# Patient Record
Sex: Male | Born: 1995 | Race: White | Hispanic: No | Marital: Single | Smoking: Never smoker
Health system: Southern US, Community
[De-identification: ages and names within clinical notes are randomized; demographics above are authoritative.]

## PROBLEM LIST (undated history)

## (undated) DIAGNOSIS — J45909 Unspecified asthma, uncomplicated: Secondary | ICD-10-CM

## (undated) HISTORY — PX: WISDOM TOOTH EXTRACTION: SHX21

---
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
## 2016-03-03 NOTE — H&P (Signed)
  Subjective:    Patient ID: Gerald Silva is a 20 y.o. male.  Follow-up   Here for follow up discussion prior to planned septoplasty, inferior turbinate reduction. Presented with complaint for years of feeling stuffy alternating with runny nose. Notes multiple trauma to nose from playing sports, but feels nose has been deviated to right as he has grown. Feels he breathes better out of right. Denies snoring but reports mouth breathing while sleeping. Has tried OTC allergy medication without improvement, did not some improvement in breathing with Afrin. Twin sister is patient for nasal trauma. Mother reports allergy testing as child; patient reports some worsening of symptoms with pollen exposure.  Review of Systems     Objective:   Physical Exam  Constitutional: He appears well-developed and well-nourished.  Cardiovascular: Normal rate and normal heart sounds.   Pulmonary/Chest: Effort normal and breath sounds normal.  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 Counseled this may have no effect on appearance nose. Reviewed incision over caudal septum, resection of this, scoring of septum. Reviewed intranasal splints for 5-7 days, OP surgery, GA, risks dryness breathing, continued obstruction, common to have nausea with this procedure.   All Rx provided today, request phenergan as has had nausea with pain medication in past.  At today's visit had questions regarding rhinoplasty as has some desire for straighter appearing nose. Reviewed incisions for this open vs closed and need for osteotomies, external splint, pain  bruising and post op limitation for this. This would more effectively straighten nose but with greater recovery. Patient and mother desire to proceed with septoplasty and outfracture inferior turbinates alone.   Glenna FellowsBrinda Guila Owensby, MD [facility] Pl[facility] Reconstructive Surgery [phone], pin [phone]

## 2016-03-10 ENCOUNTER — Other Ambulatory Visit (HOSPITAL_COMMUNITY): Payer: Self-pay

## 2016-03-11 ENCOUNTER — Encounter (HOSPITAL_COMMUNITY)
Admission: RE | Admit: 2016-03-11 | Discharge: 2016-03-11 | Disposition: A | Discharge status: Home / Self Care | Payer: Managed Care, Other (non HMO) | Source: Ambulatory Visit | Attending: Plastic Surgery | Admitting: Plastic Surgery

## 2016-03-11 ENCOUNTER — Encounter (HOSPITAL_COMMUNITY): Payer: Self-pay

## 2016-03-11 DIAGNOSIS — J342 Deviated nasal septum: Secondary | ICD-10-CM | POA: Diagnosis present

## 2016-03-11 DIAGNOSIS — J343 Hypertrophy of nasal turbinates: Secondary | ICD-10-CM | POA: Diagnosis not present

## 2016-03-11 HISTORY — DX: Unspecified asthma, uncomplicated: J45.909

## 2016-03-11 LAB — CBC
HEMATOCRIT: 45.2 % (ref 39.0–52.0)
HEMOGLOBIN: 15.2 g/dL (ref 13.0–17.0)
MCH: 27.9 pg (ref 26.0–34.0)
MCHC: 33.6 g/dL (ref 30.0–36.0)
MCV: 83.1 fL (ref 78.0–100.0)
Platelets: 260 10*3/uL (ref 150–400)
RBC: 5.44 MIL/uL (ref 4.22–5.81)
RDW: 13.1 % (ref 11.5–15.5)
WBC: 7.3 10*3/uL (ref 4.0–10.5)

## 2016-03-11 NOTE — Pre-Procedure Instructions (Signed)
    Levander CampionCharles C Wrightsman  03/11/2016    Your procedure is scheduled on Friday, July 21..  [facility]                  Your surgery or procedure is scheduled for 7:30 AM   Call this number if you have problems the morning of surgery: [phone]   Remember:  Do not eat food or drink liquids after midnight.  Take these medicines the morning of surgery with A SIP OF WATER : -    Do not wear jewelry, make-up or nail polish.  Do not wear lotions, powders, or perfumes.               Men may shave face and neck.  Do not bring valuables to the hospital.  [facility] any belongings or valuables.  Contacts, dentures or bridgework may not be worn into surgery.  Leave your suitcase in the car.  After surgery it may be brought to your room.  For patients admitted to the hospital, discharge time will be determined by your treatment team.  Special instructions:  Review  [facility] - Preparing For Surgery.  Please read over the following fact sheets that you were given. Coughing and Deep Breathing and Review  [facility] - Preparing For Surgery.

## 2016-03-11 NOTE — Anesthesia Preprocedure Evaluation (Addendum)
Anesthesia Evaluation  Patient identified by MRN, date of birth, ID band Patient awake    Reviewed: Allergy & Precautions, NPO status , Patient's Chart, lab work & pertinent test results  History of Anesthesia Complications Negative for: history of anesthetic complications  Airway Mallampati: I  TM Distance: >3 FB Neck ROM: Full    Dental  (+) Teeth Intact, Dental Advisory Given   Pulmonary    Pulmonary exam normal breath sounds clear to auscultation       Cardiovascular Exercise Tolerance: Good negative cardio ROS Normal cardiovascular exam Rhythm:Regular Rate:Normal     Neuro/Psych negative neurological ROS  negative psych ROS   GI/Hepatic negative GI ROS, Neg liver ROS,   Endo/Other  negative endocrine ROS  Renal/GU negative Renal ROS     Musculoskeletal negative musculoskeletal ROS (+)   Abdominal   Peds negative pediatric ROS (+)  Hematology negative hematology ROS (+)   Anesthesia Other Findings Day of surgery medications reviewed with the patient.  Reproductive/Obstetrics                            Anesthesia Physical Anesthesia Plan  ASA: I  Anesthesia Plan: General   Post-op Pain Management:    Induction: Intravenous  Airway Management Planned: Oral ETT  Additional Equipment:   Intra-op Plan:   Post-operative Plan: Extubation in OR  Informed Consent: I have reviewed the patients History and Physical, chart, labs and discussed the procedure including the risks, benefits and alternatives for the proposed anesthesia with the patient or authorized representative who has indicated his/her understanding and acceptance.   Dental advisory given  Plan Discussed with: CRNA  Anesthesia Plan Comments: (Risks/benefits of general anesthesia discussed with patient including risk of damage to teeth, lips, gum, and tongue, nausea/vomiting, allergic reactions to medications, and  the possibility of heart attack, stroke and death.  All patient questions answered.  Patient wishes to proceed.)       Anesthesia Quick Evaluation

## 2016-03-12 ENCOUNTER — Ambulatory Visit (HOSPITAL_COMMUNITY): Payer: Managed Care, Other (non HMO) | Admitting: Certified Registered"

## 2016-03-12 ENCOUNTER — Ambulatory Visit (HOSPITAL_COMMUNITY)
Admission: RE | Admit: 2016-03-12 | Discharge: 2016-03-12 | Disposition: A | Discharge status: Home / Self Care | Payer: Managed Care, Other (non HMO) | Source: Ambulatory Visit | Attending: Plastic Surgery | Admitting: Plastic Surgery

## 2016-03-12 ENCOUNTER — Encounter (HOSPITAL_COMMUNITY): Payer: Self-pay | Admitting: *Deleted

## 2016-03-12 ENCOUNTER — Encounter (HOSPITAL_COMMUNITY)
Admission: RE | Disposition: A | Discharge status: Home / Self Care | Payer: Self-pay | Source: Ambulatory Visit | Attending: Plastic Surgery

## 2016-03-12 DIAGNOSIS — J343 Hypertrophy of nasal turbinates: Secondary | ICD-10-CM | POA: Insufficient documentation

## 2016-03-12 DIAGNOSIS — J342 Deviated nasal septum: Secondary | ICD-10-CM | POA: Insufficient documentation

## 2016-03-12 HISTORY — PX: NASAL SEPTOPLASTY W/ TURBINOPLASTY: SHX2070

## 2016-03-12 SURGERY — SEPTOPLASTY, NOSE, WITH NASAL TURBINATE REDUCTION
Anesthesia: General | Site: Nose

## 2016-03-12 MED ORDER — LIDOCAINE-EPINEPHRINE 1 %-1:100000 IJ SOLN
INTRAMUSCULAR | Status: DC | PRN
  Administered 2016-03-12: 8 mL

## 2016-03-12 MED ORDER — FENTANYL CITRATE (PF) 100 MCG/2ML IJ SOLN
25.0000 ug | INTRAMUSCULAR | Status: DC | PRN
  Administered 2016-03-12: 25 ug via INTRAVENOUS
  Administered 2016-03-12: 50 ug via INTRAVENOUS
  Administered 2016-03-12: 25 ug via INTRAVENOUS
  Administered 2016-03-12: 50 ug via INTRAVENOUS

## 2016-03-12 MED ORDER — MIDAZOLAM HCL 5 MG/5ML IJ SOLN
INTRAMUSCULAR | Status: DC | PRN
  Administered 2016-03-12: 2 mg via INTRAVENOUS

## 2016-03-12 MED ORDER — PROMETHAZINE HCL 25 MG/ML IJ SOLN: 6.2500 mg | INTRAMUSCULAR | Status: DC | PRN

## 2016-03-12 MED ORDER — 0.9 % SODIUM CHLORIDE (POUR BTL) OPTIME
TOPICAL | Status: DC | PRN
  Administered 2016-03-12: 1000 mL

## 2016-03-12 MED ORDER — LIDOCAINE HCL (CARDIAC) 20 MG/ML IV SOLN
INTRAVENOUS | Status: DC | PRN
  Administered 2016-03-12: 100 mg via INTRAVENOUS

## 2016-03-12 MED ORDER — LACTATED RINGERS IV SOLN
INTRAVENOUS | Status: DC | PRN
  Administered 2016-03-12: 07:00:00 via INTRAVENOUS

## 2016-03-12 MED ORDER — LIDOCAINE-EPINEPHRINE (PF) 1 %-1:200000 IJ SOLN
INTRAMUSCULAR | Status: AC
  Filled 2016-03-12: qty 30

## 2016-03-12 MED ORDER — ROCURONIUM BROMIDE 50 MG/5ML IV SOLN
INTRAVENOUS | Status: AC
  Filled 2016-03-12: qty 1

## 2016-03-12 MED ORDER — SUGAMMADEX SODIUM 200 MG/2ML IV SOLN
INTRAVENOUS | Status: AC
  Filled 2016-03-12: qty 2

## 2016-03-12 MED ORDER — ARTIFICIAL TEARS OP OINT
TOPICAL_OINTMENT | OPHTHALMIC | Status: AC
  Filled 2016-03-12: qty 3.5

## 2016-03-12 MED ORDER — FENTANYL CITRATE (PF) 100 MCG/2ML IJ SOLN
INTRAMUSCULAR | Status: DC | PRN
  Administered 2016-03-12 (×4): 50 ug via INTRAVENOUS

## 2016-03-12 MED ORDER — ROCURONIUM BROMIDE 100 MG/10ML IV SOLN
INTRAVENOUS | Status: DC | PRN
  Administered 2016-03-12: 50 mg via INTRAVENOUS

## 2016-03-12 MED ORDER — BACITRACIN ZINC 500 UNIT/GM EX OINT
TOPICAL_OINTMENT | CUTANEOUS | Status: DC | PRN
  Administered 2016-03-12: 1 via TOPICAL

## 2016-03-12 MED ORDER — FENTANYL CITRATE (PF) 100 MCG/2ML IJ SOLN
INTRAMUSCULAR | Status: AC
  Filled 2016-03-12: qty 2

## 2016-03-12 MED ORDER — ONDANSETRON HCL 4 MG/2ML IJ SOLN
INTRAMUSCULAR | Status: AC
  Filled 2016-03-12: qty 2

## 2016-03-12 MED ORDER — OXYMETAZOLINE HCL 0.05 % NA SOLN
NASAL | Status: AC
  Filled 2016-03-12: qty 15

## 2016-03-12 MED ORDER — PROPOFOL 10 MG/ML IV BOLUS
INTRAVENOUS | Status: AC
  Filled 2016-03-12: qty 40

## 2016-03-12 MED ORDER — PROPOFOL 10 MG/ML IV BOLUS
INTRAVENOUS | Status: DC | PRN
  Administered 2016-03-12: 200 mg via INTRAVENOUS

## 2016-03-12 MED ORDER — SUGAMMADEX SODIUM 200 MG/2ML IV SOLN
INTRAVENOUS | Status: DC | PRN
  Administered 2016-03-12: 200 mg via INTRAVENOUS

## 2016-03-12 MED ORDER — DEXAMETHASONE SODIUM PHOSPHATE 10 MG/ML IJ SOLN
INTRAMUSCULAR | Status: DC | PRN
  Administered 2016-03-12: 10 mg via INTRAVENOUS

## 2016-03-12 MED ORDER — OXYMETAZOLINE HCL 0.05 % NA SOLN
NASAL | Status: DC | PRN
  Administered 2016-03-12: 1 via TOPICAL

## 2016-03-12 MED ORDER — DEXAMETHASONE SODIUM PHOSPHATE 10 MG/ML IJ SOLN
INTRAMUSCULAR | Status: AC
  Filled 2016-03-12: qty 1

## 2016-03-12 MED ORDER — ARTIFICIAL TEARS OP OINT
TOPICAL_OINTMENT | OPHTHALMIC | Status: DC | PRN
  Administered 2016-03-12: 1 via OPHTHALMIC

## 2016-03-12 MED ORDER — MIDAZOLAM HCL 2 MG/2ML IJ SOLN
INTRAMUSCULAR | Status: AC
  Filled 2016-03-12: qty 2

## 2016-03-12 MED ORDER — CEFAZOLIN SODIUM-DEXTROSE 2-4 GM/100ML-% IV SOLN
INTRAVENOUS | Status: AC
  Filled 2016-03-12: qty 100

## 2016-03-12 MED ORDER — CEFAZOLIN SODIUM-DEXTROSE 2-4 GM/100ML-% IV SOLN
2.0000 g | INTRAVENOUS | Status: AC
  Administered 2016-03-12: 2 g via INTRAVENOUS

## 2016-03-12 MED ORDER — ONDANSETRON HCL 4 MG/2ML IJ SOLN
INTRAMUSCULAR | Status: DC | PRN
  Administered 2016-03-12: 4 mg via INTRAVENOUS

## 2016-03-12 MED ORDER — BACITRACIN ZINC 500 UNIT/GM EX OINT
TOPICAL_OINTMENT | CUTANEOUS | Status: AC
  Filled 2016-03-12: qty 28.35

## 2016-03-12 MED ORDER — SCOPOLAMINE 1 MG/3DAYS TD PT72
MEDICATED_PATCH | TRANSDERMAL | Status: DC | PRN
  Administered 2016-03-12: 1 via TRANSDERMAL

## 2016-03-12 MED ORDER — FENTANYL CITRATE (PF) 250 MCG/5ML IJ SOLN
INTRAMUSCULAR | Status: AC
  Filled 2016-03-12: qty 5

## 2016-03-12 SURGICAL SUPPLY — 39 items
BLADE 10 SAFETY STRL DISP (BLADE) IMPLANT
BLADE SURG 15 STRL LF DISP TIS (BLADE) ×2 IMPLANT
BLADE SURG 15 STRL SS (BLADE) ×2
CANISTER SUCTION 2500CC (MISCELLANEOUS) ×4 IMPLANT
COVER MAYO STAND STRL (DRAPES) ×4 IMPLANT
COVER SURGICAL LIGHT HANDLE (MISCELLANEOUS) ×4 IMPLANT
COVER TABLE BACK 60X90 (DRAPES) ×4 IMPLANT
DRAPE U-SHAPE 76X120 STRL (DRAPES) ×4 IMPLANT
ELECT COATED BLADE 2.86 ST (ELECTRODE) ×4 IMPLANT
ELECT REM PT RETURN 9FT ADLT (ELECTROSURGICAL) ×4
ELECTRODE REM PT RTRN 9FT ADLT (ELECTROSURGICAL) ×2 IMPLANT
GAUZE SPONGE 2X2 8PLY STRL LF (GAUZE/BANDAGES/DRESSINGS) ×2 IMPLANT
GAUZE SPONGE 4X4 12PLY STRL (GAUZE/BANDAGES/DRESSINGS) ×4 IMPLANT
GAUZE SPONGE 4X4 16PLY XRAY LF (GAUZE/BANDAGES/DRESSINGS) ×4 IMPLANT
GLOVE BIO SURGEON STRL SZ 6 (GLOVE) ×4 IMPLANT
GLOVE BIO SURGEON STRL SZ 6.5 (GLOVE) ×3 IMPLANT
GLOVE BIO SURGEONS STRL SZ 6.5 (GLOVE) ×1
GLOVE BIOGEL PI IND STRL 8.5 (GLOVE) ×2 IMPLANT
GLOVE BIOGEL PI INDICATOR 8.5 (GLOVE) ×2
GOWN STRL REUS W/ TWL LRG LVL3 (GOWN DISPOSABLE) ×4 IMPLANT
GOWN STRL REUS W/TWL LRG LVL3 (GOWN DISPOSABLE) ×4
KIT BASIN OR (CUSTOM PROCEDURE TRAY) ×4 IMPLANT
KIT ROOM TURNOVER OR (KITS) ×4 IMPLANT
NEEDLE HYPO 25GX1X1/2 BEV (NEEDLE) ×4 IMPLANT
NS IRRIG 1000ML POUR BTL (IV SOLUTION) ×4 IMPLANT
PAD ARMBOARD 7.5X6 YLW CONV (MISCELLANEOUS) ×8 IMPLANT
PATTIES SURGICAL .5 X3 (DISPOSABLE) ×4 IMPLANT
PENCIL BUTTON HOLSTER BLD 10FT (ELECTRODE) ×4 IMPLANT
SOL PREP POV-IOD 4OZ 10% (MISCELLANEOUS) ×4 IMPLANT
SPLINT NASAL DOYLE BI-VL (GAUZE/BANDAGES/DRESSINGS) ×4 IMPLANT
SPLINT NASAL THERMO PLAST (MISCELLANEOUS) ×4 IMPLANT
SPONGE GAUZE 2X2 STER 10/PKG (GAUZE/BANDAGES/DRESSINGS) ×2
SUT CHROMIC 5 0 P 3 (SUTURE) ×4 IMPLANT
SUT PROLENE 2 0 SH 30 (SUTURE) ×4 IMPLANT
SYR CONTROL 10ML LL (SYRINGE) ×4 IMPLANT
TOWEL OR 17X24 6PK STRL BLUE (TOWEL DISPOSABLE) ×8 IMPLANT
TUBE CONNECTING 12'X1/4 (SUCTIONS) ×1
TUBE CONNECTING 12X1/4 (SUCTIONS) ×3 IMPLANT
WATER STERILE IRR 1000ML POUR (IV SOLUTION) ×4 IMPLANT

## 2016-03-12 NOTE — Interval H&P Note (Signed)
History and Physical Interval Note:  03/12/2016 7:02 AM  Gerald Silva  has presented today for surgery, with the diagnosis of congenital deviation septum turbinate hypertrophy  The various methods of treatment have been discussed with the patient and family. After consideration of risks, benefits and other options for treatment, the patient has consented to  Procedure(s): NASAL SEPTOPLASTY WITH inferior TURBINATE out fracture bilateral (N/A)  septoRHINOPLASTY, possible (N/A) as a surgical intervention .  The patient's history has been reviewed, patient examined, no change in status, stable for surgery.  I have reviewed the patient's chart and labs.  Questions were answered to the patient's satisfaction.     [name]

## 2016-03-12 NOTE — Anesthesia Procedure Notes (Signed)
Procedure Name: Intubation Date/Time: 03/12/2016 7:41 AM Performed by: Fransisca KaufmannMEYER, Yaqueline Gutter E Pre-anesthesia Checklist: Patient identified, Emergency Drugs available, Suction available and Patient being monitored Patient Re-evaluated:Patient Re-evaluated prior to inductionOxygen Delivery Method: Circle System Utilized Preoxygenation: Pre-oxygenation with 100% oxygen Intubation Type: IV induction Ventilation: Mask ventilation without difficulty Laryngoscope Size: Miller and 3 Grade View: Grade I Tube type: Oral Tube size: 7.5 mm Number of attempts: 1 Airway Equipment and Method: Stylet and Oral airway Placement Confirmation: ETT inserted through vocal cords under direct vision,  positive ETCO2 and breath sounds checked- equal and bilateral Secured at: 23 cm Tube secured with: Tape Dental Injury: Teeth and Oropharynx as per pre-operative assessment

## 2016-03-12 NOTE — Op Note (Signed)
Operative Note   DATE OF OPERATION: 7.21.17  [facility]  SURGICAL DIVISION: Plastic Surgery  PREOPERATIVE DIAGNOSES:  1. Congenital septal deviation 2. Inferior turbinate hypertrophy  POSTOPERATIVE DIAGNOSES:  same  PROCEDURE:  1. Septoplasty 2. Inferior turbinate outfracture bilateral  SURGEON: Glenna FellowsBrinda Lexany Belknap MD MBA  ASSISTANT: none  ANESTHESIA:  General.   EBL: 50 ml  COMPLICATIONS: None immediate.   INDICATIONS FOR PROCEDURE:  The patient, Gerald Silva, is a 20 y.o. male born on Jan 17, 1996, is here for septoplasty and turbinate outfracture for treatment obstructed breathing that has failed medical measures.   FINDINGS: C shaped septum with caudal septum deviated to right. Left inferior turbinate abuts septum.  DESCRIPTION OF PROCEDURE:  The patient was taken to the operating room. SCDs were placed and IV antibiotics were given.  A time out was performed and all information was confirmed to be correct. Local anesthetic infiltrated to perform bilateral inferior orbital nerve blocks, along septal mucosa and into inferior turbinates. Afrin and local soaked pledges placed in nasal cavities. The patient's operative site was prepped and draped in the usual fashion. Incision made over right caudal septal mucosa and carried to perichondrium over septum. This was incised and subperichondrial dissection completed over both sides septum. The septum was detatched from anterior nasal spine, vomer, and ethmoid with elevators and caudal septum excised with scissors. Scoring performed over concave surface of C shaped septum. Deviated bony septum was excised with forceps. Nasal speculum used to outfracture bilateral inferior turbinates and left side also treated with cauterization. Stomach contents and oropharynx suctioned. Septal incision closed with 5-0 chromic. Doyle splints placed and secured to membranous septum with 2-0 prolene.   The patient was allowed to wake from  anesthesia, extubated and taken to the recovery room in satisfactory condition.   SPECIMENS:none  DRAINS: none  Glenna FellowsBrinda Kahliyah Dick, MD [facility] Plastic & Reconstructive Surgery [phone], pin [phone]

## 2016-03-12 NOTE — OR Nursing (Signed)
Extra education given r/t BP and encourage patient and parents to monitor in nonclinical settings to establish baseline for primary MD.

## 2016-03-12 NOTE — Anesthesia Postprocedure Evaluation (Signed)
Anesthesia Post Note  Patient: Gerald Silva  Procedure(s) Performed: Procedure(s) (LRB): NASAL SEPTOPLASTY WITH inferior TURBINATE out fracture bilateral (N/A)  Patient location during evaluation: PACU Anesthesia Type: General Level of consciousness: awake and alert Pain management: pain level controlled Vital Signs Assessment: post-procedure vital signs reviewed and stable Respiratory status: spontaneous breathing, nonlabored ventilation, respiratory function stable and patient connected to nasal cannula oxygen Cardiovascular status: blood pressure returned to baseline and stable Postop Assessment: no signs of nausea or vomiting Anesthetic complications: no    Last Vitals:  Filed Vitals:   03/12/16 1035 03/12/16 1050  BP: 152/92 147/99  Pulse: 68 82  Temp:  36.6 C  Resp: 12 15    Last Pain:  Filed Vitals:   03/12/16 1124  PainSc: 2                  Cecile HearingStephen Edward Ariyon Gerstenberger

## 2016-03-12 NOTE — Transfer of Care (Signed)
Immediate Anesthesia Transfer of Care Note  Patient: Gerald Silva  Procedure(s) Performed: Procedure(s): NASAL SEPTOPLASTY WITH inferior TURBINATE out fracture bilateral (N/A)  Patient Location: PACU  Anesthesia Type:General  Level of Consciousness: awake, alert , oriented and sedated  Airway & Oxygen Therapy: Patient Spontanous Breathing and Patient connected to face mask oxygen  Post-op Assessment: Report given to RN, Post -op Vital signs reviewed and stable and Patient moving all extremities  Post vital signs: Reviewed and stable  Last Vitals:  Filed Vitals:   03/12/16 0618  BP: 151/82  Pulse: 92  Temp: 36.7 C  Resp: 20    Last Pain: There were no vitals filed for this visit.       Complications: No apparent anesthesia complications

## 2016-03-14 ENCOUNTER — Encounter (HOSPITAL_COMMUNITY): Payer: Self-pay | Admitting: Plastic Surgery
